# Patient Record
Sex: Female | Born: 2000 | Race: Black or African American | Hispanic: No | Marital: Single
Health system: Southern US, Community
[De-identification: ages and names within clinical notes are randomized; demographics above are authoritative.]

## PROBLEM LIST (undated history)

## (undated) DIAGNOSIS — J45909 Unspecified asthma, uncomplicated: Secondary | ICD-10-CM

---
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
## 2020-02-03 IMAGING — CR DG CHEST 2V
2 series · 2 of 2 positions shown · non-contrast
Comparison: None.

CLINICAL DATA: Shortness of breath

EXAM:
CHEST - 2 VIEW

[chest pa]
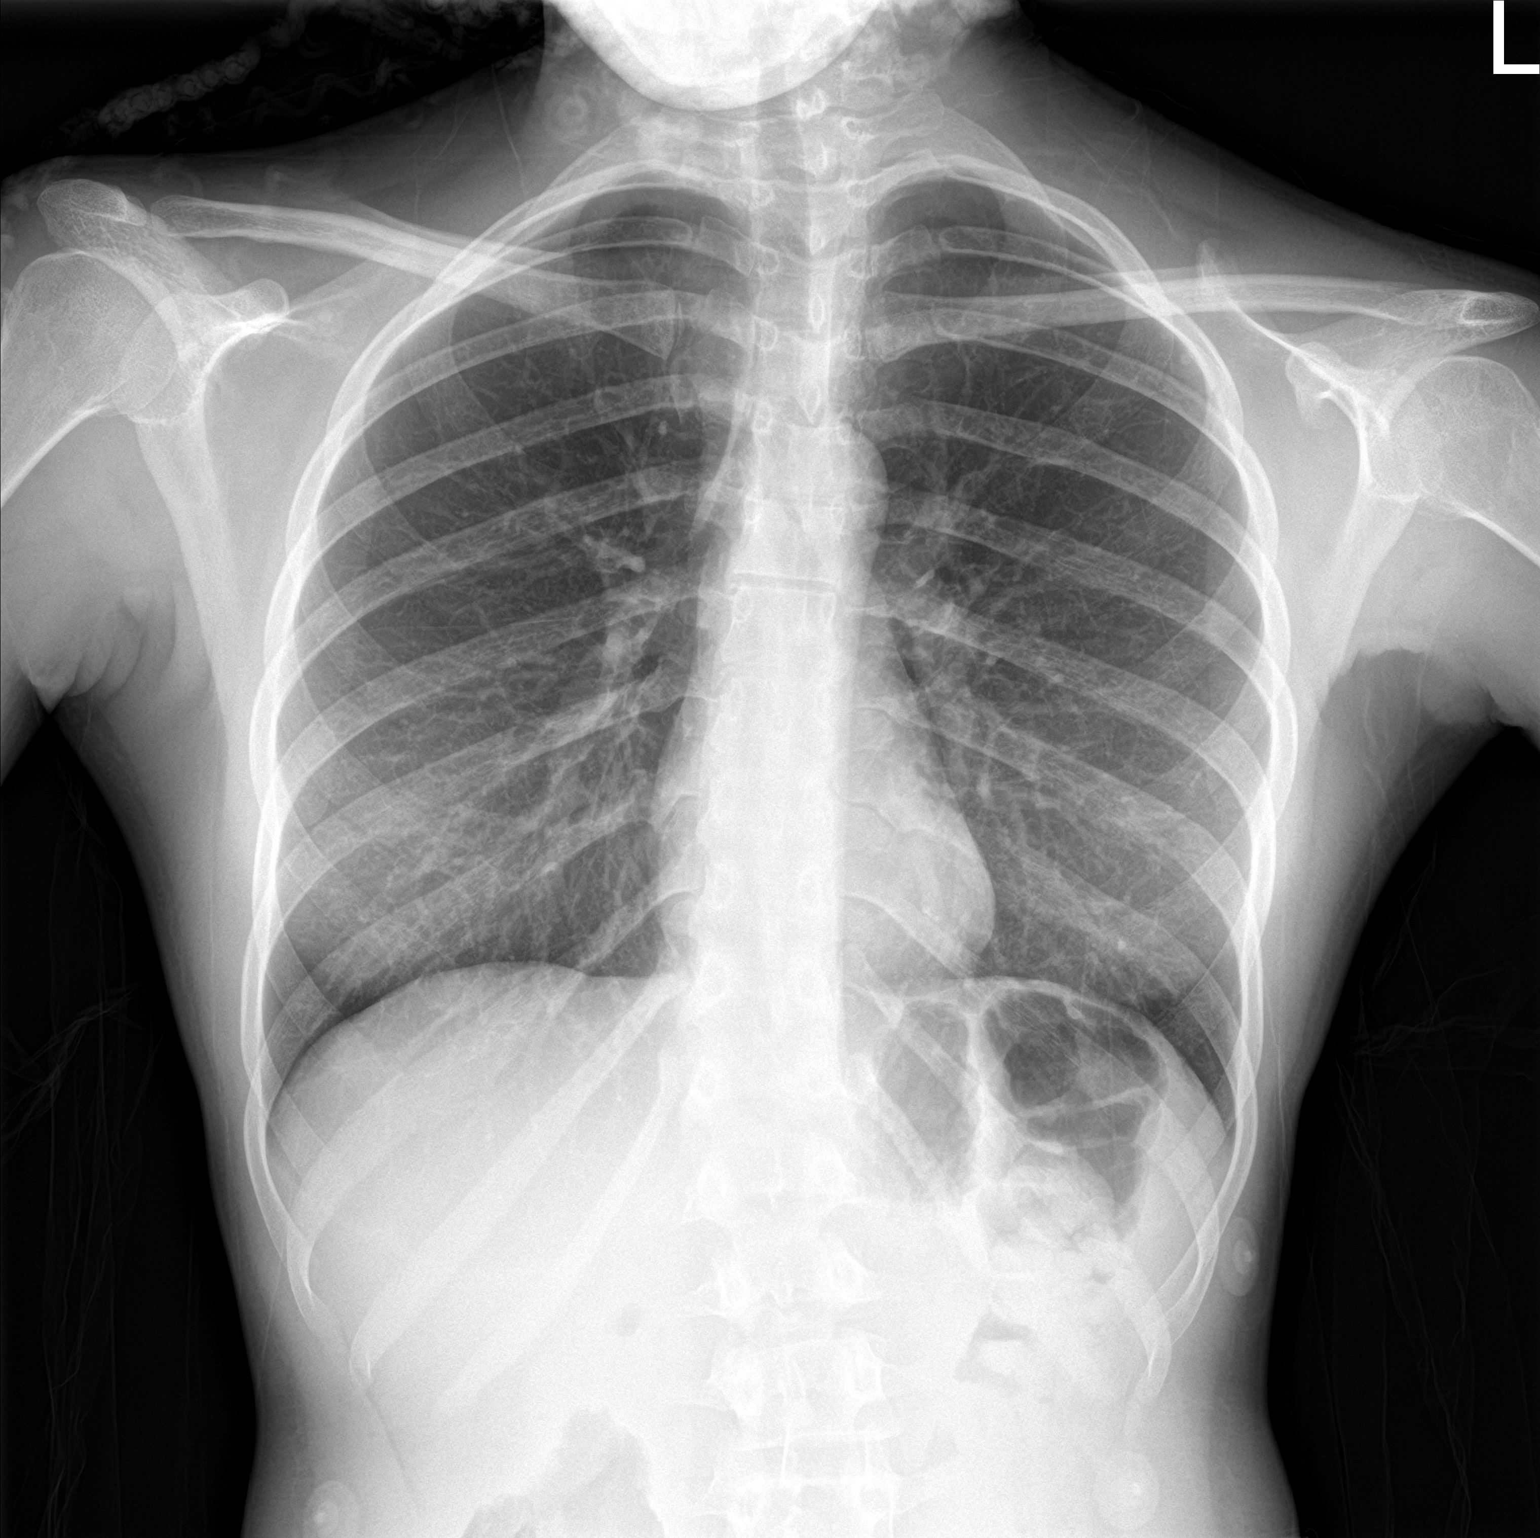

[chest lat]
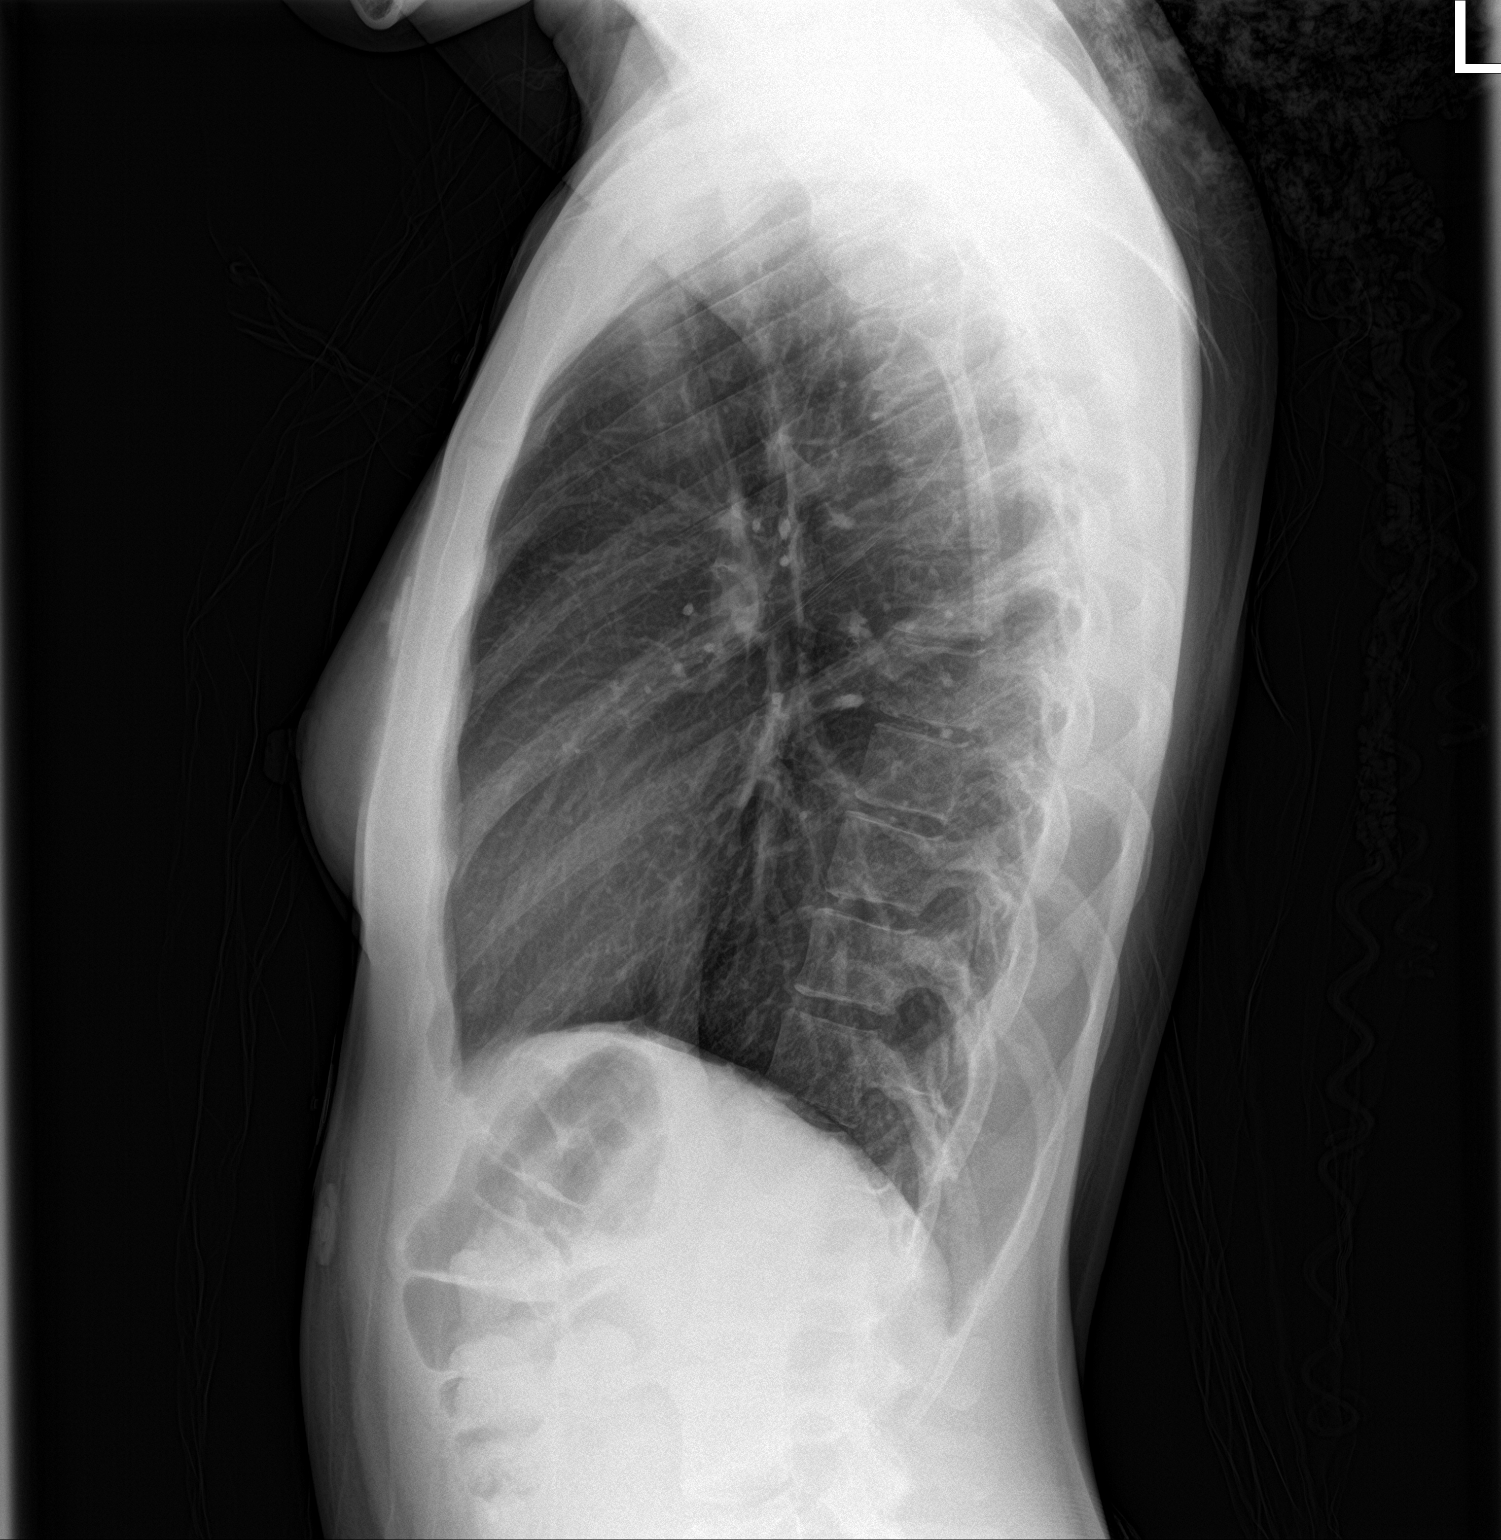

[2 of 2 positions shown; findings below may reference images not displayed]

FINDINGS: Cardiac shadows within normal limits. The lungs are well aerated
bilaterally. No focal infiltrate or sizable effusion is seen. No
bony abnormality is noted.
IMPRESSION: No active cardiopulmonary disease.
